# Patient Record
Sex: Female | Born: 1975 | Race: White | Hispanic: No | Marital: Married | Smoking: Never smoker
Health system: Southern US, Community
[De-identification: ages and names within clinical notes are randomized; demographics above are authoritative.]

## PROBLEM LIST (undated history)

## (undated) DIAGNOSIS — Z789 Other specified health status: Secondary | ICD-10-CM

## (undated) HISTORY — PX: OTHER SURGICAL HISTORY: SHX169

## (undated) HISTORY — PX: BREAST SURGERY: SHX581

---

## 2018-08-10 ENCOUNTER — Other Ambulatory Visit: Payer: Self-pay | Admitting: Neurosurgery

## 2018-08-17 NOTE — Pre-Procedure Instructions (Signed)
Wilkie AyeKristy Breth  08/17/2018      [facility] - [place], [place] - [id number] [place] [id number] Phone: [phone] Fax: [phone]    Your procedure is scheduled on Friday December 6.  [facility] Admitting at 1:15 P.M.  Call this number if you have problems the morning of surgery:  [phone]   Remember:  Do not eat or drink after midnight.    Take these medicines the morning of surgery with A SIP OF WATER: Gabapentin (neurontin)  7 days prior to surgery STOP taking any Aspirin(unless otherwise instructed by your surgeon), Aleve, Naproxen, Ibuprofen, Motrin, Advil, Goody's, BC's, all herbal medications, fish oil, and all vitamins     Do not wear jewelry, make-up or nail polish.  Do not wear lotions, powders, or perfumes, or deodorant.  Do not shave 48 hours prior to surgery.  Men may shave face and neck.  Do not bring valuables to the hospital.  [facility] is not responsible for any belongings or valuables.  Contacts, dentures or bridgework may not be worn into surgery.  Leave your suitcase in the car.  After surgery it may be brought to your room.  For patients admitted to the hospital, discharge time will be determined by your treatment team.  Patients discharged the day of surgery will not be allowed to drive home.   Special instructions:    [place]- Preparing For Surgery  Before surgery, you can play an important role. Because skin is not sterile, your skin needs to be as free of germs as possible. You can reduce the number of germs on your skin by washing with CHG (chlorahexidine gluconate) Soap before surgery.  CHG is an antiseptic cleaner which kills germs and bonds with the skin to continue killing germs even after washing.    Oral Hygiene is also important to reduce your risk of infection.  Remember - BRUSH YOUR TEETH THE MORNING OF SURGERY WITH YOUR REGULAR TOOTHPASTE  Please do not use if you  have an allergy to CHG or antibacterial soaps. If your skin becomes reddened/irritated stop using the CHG.  Do not shave (including legs and underarms) for at least 48 hours prior to first CHG shower. It is OK to shave your face.  Please follow these instructions carefully.   1. Shower the NIGHT BEFORE SURGERY and the MORNING OF SURGERY with CHG.   2. If you chose to wash your hair, wash your hair first as usual with your normal shampoo.  3. After you shampoo, rinse your hair and body thoroughly to remove the shampoo.  4. Use CHG as you would any other liquid soap. You can apply CHG directly to the skin and wash gently with a scrungie or a clean washcloth.   5. Apply the CHG Soap to your body ONLY FROM THE NECK DOWN.  Do not use on open wounds or open sores. Avoid contact with your eyes, ears, mouth and genitals (private parts). Wash Face and genitals (private parts)  with your normal soap.  6. Wash thoroughly, paying special attention to the area where your surgery will be performed.  7. Thoroughly rinse your body with warm water from the neck down.  8. DO NOT shower/wash with your normal soap after using and rinsing off the CHG Soap.  9. Pat yourself dry with a CLEAN TOWEL.  10. Wear CLEAN PAJAMAS to bed the night before surgery, wear comfortable clothes the morning  of surgery  11. Place CLEAN SHEETS on your bed the night of your first shower and DO NOT SLEEP WITH PETS.    Day of Surgery:  Do not apply any deodorants/lotions.  Please wear clean clothes to the hospital/surgery center.   Remember to brush your teeth WITH YOUR REGULAR TOOTHPASTE.    Please read over the following fact sheets that you were given. Coughing and Deep Breathing, MRSA Information and Surgical Site Infection Prevention

## 2018-08-20 ENCOUNTER — Encounter (HOSPITAL_COMMUNITY): Payer: Self-pay | Admitting: *Deleted

## 2018-08-20 ENCOUNTER — Other Ambulatory Visit: Payer: Self-pay

## 2018-08-20 ENCOUNTER — Encounter (HOSPITAL_COMMUNITY)
Admission: RE | Admit: 2018-08-20 | Discharge: 2018-08-20 | Disposition: A | Discharge status: Home / Self Care | Payer: BLUE CROSS/BLUE SHIELD | Source: Ambulatory Visit | Attending: Neurosurgery | Admitting: Neurosurgery

## 2018-08-20 DIAGNOSIS — Z01812 Encounter for preprocedural laboratory examination: Secondary | ICD-10-CM | POA: Insufficient documentation

## 2018-08-20 HISTORY — DX: Other specified health status: Z78.9

## 2018-08-20 LAB — CBC
HCT: 44.7 % (ref 36.0–46.0)
Hemoglobin: 14.6 g/dL (ref 12.0–15.0)
MCH: 31.2 pg (ref 26.0–34.0)
MCHC: 32.7 g/dL (ref 30.0–36.0)
MCV: 95.5 fL (ref 80.0–100.0)
Platelets: 385 10*3/uL (ref 150–400)
RBC: 4.68 MIL/uL (ref 3.87–5.11)
RDW: 12.2 % (ref 11.5–15.5)
WBC: 10.1 10*3/uL (ref 4.0–10.5)
nRBC: 0 % (ref 0.0–0.2)

## 2018-08-20 LAB — SURGICAL PCR SCREEN
MRSA, PCR: NEGATIVE
Staphylococcus aureus: NEGATIVE

## 2018-08-20 NOTE — Progress Notes (Addendum)
IB message sent to Dr. Lenward ChancellorNudelman,oreders need 2nd sign,pt. Has pre-admission visit today.  [facility]

## 2018-08-22 ENCOUNTER — Other Ambulatory Visit: Payer: Self-pay | Admitting: Neurosurgery

## 2018-08-24 ENCOUNTER — Ambulatory Visit (HOSPITAL_COMMUNITY): Payer: BLUE CROSS/BLUE SHIELD

## 2018-08-24 ENCOUNTER — Ambulatory Visit (HOSPITAL_COMMUNITY): Payer: BLUE CROSS/BLUE SHIELD | Admitting: Certified Registered Nurse Anesthetist

## 2018-08-24 ENCOUNTER — Observation Stay (HOSPITAL_COMMUNITY)
Admission: RE | Admit: 2018-08-24 | Discharge: 2018-08-25 | Disposition: A | Discharge status: Home / Self Care | Payer: BLUE CROSS/BLUE SHIELD | Source: Ambulatory Visit | Attending: Neurosurgery | Admitting: Neurosurgery

## 2018-08-24 ENCOUNTER — Encounter (HOSPITAL_COMMUNITY): Payer: Self-pay | Admitting: *Deleted

## 2018-08-24 ENCOUNTER — Other Ambulatory Visit: Payer: Self-pay

## 2018-08-24 ENCOUNTER — Encounter (HOSPITAL_COMMUNITY)
Admission: RE | Disposition: A | Discharge status: Home / Self Care | Payer: Self-pay | Source: Ambulatory Visit | Attending: Neurosurgery

## 2018-08-24 DIAGNOSIS — Z79899 Other long term (current) drug therapy: Secondary | ICD-10-CM | POA: Insufficient documentation

## 2018-08-24 DIAGNOSIS — M5116 Intervertebral disc disorders with radiculopathy, lumbar region: Secondary | ICD-10-CM | POA: Diagnosis present

## 2018-08-24 DIAGNOSIS — Z6839 Body mass index (BMI) 39.0-39.9, adult: Secondary | ICD-10-CM | POA: Diagnosis not present

## 2018-08-24 DIAGNOSIS — Z880 Allergy status to penicillin: Secondary | ICD-10-CM | POA: Diagnosis not present

## 2018-08-24 DIAGNOSIS — M4728 Other spondylosis with radiculopathy, sacral and sacrococcygeal region: Secondary | ICD-10-CM | POA: Diagnosis not present

## 2018-08-24 DIAGNOSIS — Z419 Encounter for procedure for purposes other than remedying health state, unspecified: Secondary | ICD-10-CM

## 2018-08-24 DIAGNOSIS — Q7649 Other congenital malformations of spine, not associated with scoliosis: Secondary | ICD-10-CM | POA: Insufficient documentation

## 2018-08-24 DIAGNOSIS — M5126 Other intervertebral disc displacement, lumbar region: Secondary | ICD-10-CM | POA: Diagnosis present

## 2018-08-24 HISTORY — PX: LUMBAR LAMINECTOMY/DECOMPRESSION MICRODISCECTOMY: SHX5026

## 2018-08-24 LAB — POCT PREGNANCY, URINE: Preg Test, Ur: NEGATIVE

## 2018-08-24 SURGERY — LUMBAR LAMINECTOMY/DECOMPRESSION MICRODISCECTOMY 1 LEVEL
Anesthesia: General | Laterality: Right

## 2018-08-24 MED ORDER — VANCOMYCIN HCL 1000 MG IV SOLR
INTRAVENOUS | Status: DC | PRN
  Administered 2018-08-24: 1000 mg via INTRAVENOUS

## 2018-08-24 MED ORDER — ONDANSETRON HCL 4 MG/2ML IJ SOLN
INTRAMUSCULAR | Status: DC | PRN
  Administered 2018-08-24: 4 mg via INTRAVENOUS

## 2018-08-24 MED ORDER — LIDOCAINE 2% (20 MG/ML) 5 ML SYRINGE
INTRAMUSCULAR | Status: AC
  Filled 2018-08-24: qty 5

## 2018-08-24 MED ORDER — SUGAMMADEX SODIUM 200 MG/2ML IV SOLN
INTRAVENOUS | Status: DC | PRN
  Administered 2018-08-24: 250 mg via INTRAVENOUS

## 2018-08-24 MED ORDER — LIDOCAINE-EPINEPHRINE 1 %-1:100000 IJ SOLN
INTRAMUSCULAR | Status: DC | PRN
  Administered 2018-08-24: 10 mL

## 2018-08-24 MED ORDER — HYDROCODONE-ACETAMINOPHEN 5-325 MG PO TABS
1.0000 | ORAL_TABLET | ORAL | Status: DC | PRN
  Administered 2018-08-24 – 2018-08-25 (×2): 2 via ORAL
  Filled 2018-08-24 (×2): qty 2

## 2018-08-24 MED ORDER — MIDAZOLAM HCL 5 MG/5ML IJ SOLN
INTRAMUSCULAR | Status: DC | PRN
  Administered 2018-08-24: 2 mg via INTRAVENOUS

## 2018-08-24 MED ORDER — THROMBIN 5000 UNITS EX SOLR
CUTANEOUS | Status: DC | PRN
  Administered 2018-08-24: 10000 [IU] via TOPICAL

## 2018-08-24 MED ORDER — CHLORHEXIDINE GLUCONATE CLOTH 2 % EX PADS: 6.0000 | MEDICATED_PAD | Freq: Once | CUTANEOUS | Status: DC

## 2018-08-24 MED ORDER — FENTANYL CITRATE (PF) 100 MCG/2ML IJ SOLN
INTRAMUSCULAR | Status: DC | PRN
  Administered 2018-08-24: 100 ug via INTRAVENOUS

## 2018-08-24 MED ORDER — TIZANIDINE HCL 4 MG PO TABS
4.0000 mg | ORAL_TABLET | Freq: Four times a day (QID) | ORAL | Status: DC | PRN
  Administered 2018-08-24: 4 mg via ORAL
  Filled 2018-08-24: qty 1

## 2018-08-24 MED ORDER — GABAPENTIN 300 MG PO CAPS
300.0000 mg | ORAL_CAPSULE | Freq: Three times a day (TID) | ORAL | Status: DC
  Administered 2018-08-24: 300 mg via ORAL
  Filled 2018-08-24: qty 1

## 2018-08-24 MED ORDER — PROPOFOL 10 MG/ML IV BOLUS
INTRAVENOUS | Status: DC | PRN
  Administered 2018-08-24: 200 mg via INTRAVENOUS

## 2018-08-24 MED ORDER — SODIUM CHLORIDE 0.9% FLUSH: 3.0000 mL | INTRAVENOUS | Status: DC | PRN

## 2018-08-24 MED ORDER — ROCURONIUM BROMIDE 50 MG/5ML IV SOSY
PREFILLED_SYRINGE | INTRAVENOUS | Status: AC
  Filled 2018-08-24: qty 5

## 2018-08-24 MED ORDER — KETOROLAC TROMETHAMINE 30 MG/ML IJ SOLN
30.0000 mg | Freq: Four times a day (QID) | INTRAMUSCULAR | Status: DC
  Administered 2018-08-24 – 2018-08-25 (×2): 30 mg via INTRAVENOUS
  Filled 2018-08-24 (×2): qty 1

## 2018-08-24 MED ORDER — KETOROLAC TROMETHAMINE 30 MG/ML IJ SOLN
30.0000 mg | Freq: Once | INTRAMUSCULAR | Status: AC
  Administered 2018-08-24: 30 mg via INTRAVENOUS

## 2018-08-24 MED ORDER — ACETAMINOPHEN 10 MG/ML IV SOLN
INTRAVENOUS | Status: DC | PRN
  Administered 2018-08-24: 1000 mg via INTRAVENOUS

## 2018-08-24 MED ORDER — MAGNESIUM HYDROXIDE 400 MG/5ML PO SUSP: 30.0000 mL | Freq: Every day | ORAL | Status: DC | PRN

## 2018-08-24 MED ORDER — FENTANYL CITRATE (PF) 100 MCG/2ML IJ SOLN
INTRAMUSCULAR | Status: AC
  Filled 2018-08-24: qty 2

## 2018-08-24 MED ORDER — PROPOFOL 10 MG/ML IV BOLUS
INTRAVENOUS | Status: AC
  Filled 2018-08-24: qty 40

## 2018-08-24 MED ORDER — PHENOL 1.4 % MT LIQD: 1.0000 | OROMUCOSAL | Status: DC | PRN

## 2018-08-24 MED ORDER — DIPHENHYDRAMINE HCL 50 MG/ML IJ SOLN
INTRAMUSCULAR | Status: AC
  Filled 2018-08-24: qty 1

## 2018-08-24 MED ORDER — FENTANYL CITRATE (PF) 250 MCG/5ML IJ SOLN
INTRAMUSCULAR | Status: DC | PRN
  Administered 2018-08-24: 50 ug via INTRAVENOUS
  Administered 2018-08-24: 25 ug via INTRAVENOUS
  Administered 2018-08-24 (×5): 50 ug via INTRAVENOUS
  Administered 2018-08-24: 25 ug via INTRAVENOUS
  Administered 2018-08-24: 100 ug via INTRAVENOUS
  Administered 2018-08-24: 50 ug via INTRAVENOUS

## 2018-08-24 MED ORDER — KETOROLAC TROMETHAMINE 30 MG/ML IJ SOLN
INTRAMUSCULAR | Status: AC
  Filled 2018-08-24: qty 1

## 2018-08-24 MED ORDER — METHYLPREDNISOLONE ACETATE 80 MG/ML IJ SUSP
INTRAMUSCULAR | Status: DC | PRN
  Administered 2018-08-24: 80 mg

## 2018-08-24 MED ORDER — BUPIVACAINE HCL (PF) 0.5 % IJ SOLN
INTRAMUSCULAR | Status: DC | PRN
  Administered 2018-08-24: 10 mL

## 2018-08-24 MED ORDER — SODIUM CHLORIDE 0.9 % IV SOLN: 250.0000 mL | INTRAVENOUS | Status: DC

## 2018-08-24 MED ORDER — DEXAMETHASONE SODIUM PHOSPHATE 10 MG/ML IJ SOLN
INTRAMUSCULAR | Status: AC
  Filled 2018-08-24: qty 1

## 2018-08-24 MED ORDER — ACETAMINOPHEN 650 MG RE SUPP
650.0000 mg | RECTAL | Status: DC | PRN
  Filled 2018-08-24: qty 1

## 2018-08-24 MED ORDER — DIPHENHYDRAMINE HCL 50 MG/ML IJ SOLN
INTRAMUSCULAR | Status: DC | PRN
  Administered 2018-08-24: 12.5 mg via INTRAVENOUS

## 2018-08-24 MED ORDER — THROMBIN 5000 UNITS EX SOLR
OROMUCOSAL | Status: DC | PRN
  Administered 2018-08-24: 16:00:00 via TOPICAL

## 2018-08-24 MED ORDER — MENTHOL 3 MG MT LOZG: 1.0000 | LOZENGE | OROMUCOSAL | Status: DC | PRN

## 2018-08-24 MED ORDER — FENTANYL CITRATE (PF) 250 MCG/5ML IJ SOLN
INTRAMUSCULAR | Status: AC
  Filled 2018-08-24: qty 5

## 2018-08-24 MED ORDER — CEFAZOLIN SODIUM-DEXTROSE 2-4 GM/100ML-% IV SOLN: 2.0000 g | INTRAVENOUS | Status: DC

## 2018-08-24 MED ORDER — LIDOCAINE 2% (20 MG/ML) 5 ML SYRINGE
INTRAMUSCULAR | Status: DC | PRN
  Administered 2018-08-24: 80 mg via INTRAVENOUS

## 2018-08-24 MED ORDER — HEMOSTATIC AGENTS (NO CHARGE) OPTIME
TOPICAL | Status: DC | PRN
  Administered 2018-08-24: 1 via TOPICAL

## 2018-08-24 MED ORDER — MORPHINE SULFATE (PF) 4 MG/ML IV SOLN: 4.0000 mg | INTRAVENOUS | Status: DC | PRN

## 2018-08-24 MED ORDER — THROMBIN 5000 UNITS EX SOLR
CUTANEOUS | Status: AC
  Filled 2018-08-24: qty 10000

## 2018-08-24 MED ORDER — HYDROMORPHONE HCL 1 MG/ML IJ SOLN
0.2500 mg | INTRAMUSCULAR | Status: DC | PRN
  Administered 2018-08-24 (×2): 0.5 mg via INTRAVENOUS

## 2018-08-24 MED ORDER — SODIUM CHLORIDE 0.9% FLUSH
3.0000 mL | Freq: Two times a day (BID) | INTRAVENOUS | Status: DC
  Administered 2018-08-24: 3 mL via INTRAVENOUS

## 2018-08-24 MED ORDER — BUPIVACAINE HCL (PF) 0.5 % IJ SOLN
INTRAMUSCULAR | Status: AC
  Filled 2018-08-24: qty 30

## 2018-08-24 MED ORDER — HYDROXYZINE HCL 25 MG PO TABS: 50.0000 mg | ORAL_TABLET | ORAL | Status: DC | PRN

## 2018-08-24 MED ORDER — VANCOMYCIN HCL IN DEXTROSE 1-5 GM/200ML-% IV SOLN
INTRAVENOUS | Status: AC
  Filled 2018-08-24: qty 200

## 2018-08-24 MED ORDER — SODIUM CHLORIDE 0.9 % IV SOLN
INTRAVENOUS | Status: DC | PRN
  Administered 2018-08-24: 16:00:00

## 2018-08-24 MED ORDER — HYDROXYZINE HCL 50 MG/ML IM SOLN: 50.0000 mg | INTRAMUSCULAR | Status: DC | PRN

## 2018-08-24 MED ORDER — THROMBIN 5000 UNITS EX SOLR
CUTANEOUS | Status: AC
  Filled 2018-08-24: qty 5000

## 2018-08-24 MED ORDER — CYCLOBENZAPRINE HCL 5 MG PO TABS: 5.0000 mg | ORAL_TABLET | Freq: Three times a day (TID) | ORAL | Status: DC | PRN

## 2018-08-24 MED ORDER — SCOPOLAMINE 1 MG/3DAYS TD PT72
1.0000 | MEDICATED_PATCH | TRANSDERMAL | Status: DC
  Administered 2018-08-24: 1.5 mg via TRANSDERMAL
  Filled 2018-08-24: qty 1

## 2018-08-24 MED ORDER — METHYLPREDNISOLONE ACETATE 80 MG/ML IJ SUSP
INTRAMUSCULAR | Status: AC
  Filled 2018-08-24: qty 1

## 2018-08-24 MED ORDER — ROCURONIUM BROMIDE 10 MG/ML (PF) SYRINGE
PREFILLED_SYRINGE | INTRAVENOUS | Status: DC | PRN
  Administered 2018-08-24: 10 mg via INTRAVENOUS
  Administered 2018-08-24 (×2): 20 mg via INTRAVENOUS
  Administered 2018-08-24: 50 mg via INTRAVENOUS

## 2018-08-24 MED ORDER — 0.9 % SODIUM CHLORIDE (POUR BTL) OPTIME
TOPICAL | Status: DC | PRN
  Administered 2018-08-24: 1000 mL

## 2018-08-24 MED ORDER — LACTATED RINGERS IV SOLN
INTRAVENOUS | Status: DC | PRN
  Administered 2018-08-24 (×2): via INTRAVENOUS

## 2018-08-24 MED ORDER — GENTAMICIN IN SALINE 1.6-0.9 MG/ML-% IV SOLN
80.0000 mg | INTRAVENOUS | Status: AC
  Administered 2018-08-24: 80 mg via INTRAVENOUS
  Filled 2018-08-24: qty 50

## 2018-08-24 MED ORDER — DEXAMETHASONE SODIUM PHOSPHATE 10 MG/ML IJ SOLN
INTRAMUSCULAR | Status: DC | PRN
  Administered 2018-08-24: 10 mg via INTRAVENOUS

## 2018-08-24 MED ORDER — ACETAMINOPHEN 325 MG PO TABS: 650.0000 mg | ORAL_TABLET | ORAL | Status: DC | PRN

## 2018-08-24 MED ORDER — ALUM & MAG HYDROXIDE-SIMETH 200-200-20 MG/5ML PO SUSP: 30.0000 mL | Freq: Four times a day (QID) | ORAL | Status: DC | PRN

## 2018-08-24 MED ORDER — LIDOCAINE-EPINEPHRINE 1 %-1:100000 IJ SOLN
INTRAMUSCULAR | Status: AC
  Filled 2018-08-24: qty 1

## 2018-08-24 MED ORDER — BISACODYL 10 MG RE SUPP: 10.0000 mg | Freq: Every day | RECTAL | Status: DC | PRN

## 2018-08-24 MED ORDER — HYDROMORPHONE HCL 1 MG/ML IJ SOLN
INTRAMUSCULAR | Status: AC
  Administered 2018-08-24: 0.5 mg via INTRAVENOUS
  Filled 2018-08-24: qty 1

## 2018-08-24 MED ORDER — ONDANSETRON HCL 4 MG/2ML IJ SOLN
INTRAMUSCULAR | Status: AC
  Filled 2018-08-24: qty 2

## 2018-08-24 MED ORDER — ACETAMINOPHEN 10 MG/ML IV SOLN
INTRAVENOUS | Status: AC
  Filled 2018-08-24: qty 100

## 2018-08-24 MED ORDER — FLEET ENEMA 7-19 GM/118ML RE ENEM: 1.0000 | ENEMA | Freq: Once | RECTAL | Status: DC | PRN

## 2018-08-24 MED ORDER — MIDAZOLAM HCL 2 MG/2ML IJ SOLN
INTRAMUSCULAR | Status: AC
  Filled 2018-08-24: qty 2

## 2018-08-24 MED ORDER — PROMETHAZINE HCL 25 MG/ML IJ SOLN: 6.2500 mg | INTRAMUSCULAR | Status: DC | PRN

## 2018-08-24 MED ORDER — HYDROCODONE-ACETAMINOPHEN 5-325 MG PO TABS: 1.0000 | ORAL_TABLET | ORAL | 0 refills | Status: AC | PRN

## 2018-08-24 SURGICAL SUPPLY — 58 items
BAG DECANTER FOR FLEXI CONT (MISCELLANEOUS) ×2 IMPLANT
BENZOIN TINCTURE PRP APPL 2/3 (GAUZE/BANDAGES/DRESSINGS) IMPLANT
BLADE CLIPPER SURG (BLADE) IMPLANT
BUR ACRON 5.0MM COATED (BURR) ×2 IMPLANT
BUR MATCHSTICK NEURO 3.0 LAGG (BURR) ×2 IMPLANT
CANISTER SUCT 3000ML PPV (MISCELLANEOUS) ×2 IMPLANT
CARTRIDGE OIL MAESTRO DRILL (MISCELLANEOUS) ×1 IMPLANT
COVER WAND RF STERILE (DRAPES) ×2 IMPLANT
DECANTER SPIKE VIAL GLASS SM (MISCELLANEOUS) ×2 IMPLANT
DERMABOND ADVANCED (GAUZE/BANDAGES/DRESSINGS) ×1
DERMABOND ADVANCED .7 DNX12 (GAUZE/BANDAGES/DRESSINGS) ×1 IMPLANT
DIFFUSER DRILL AIR PNEUMATIC (MISCELLANEOUS) ×2 IMPLANT
DRAPE LAPAROTOMY 100X72X124 (DRAPES) ×2 IMPLANT
DRAPE MICROSCOPE LEICA (MISCELLANEOUS) ×2 IMPLANT
DRAPE POUCH INSTRU U-SHP 10X18 (DRAPES) ×2 IMPLANT
ELECT BLADE 4.0 EZ CLEAN MEGAD (MISCELLANEOUS) ×2
ELECT REM PT RETURN 9FT ADLT (ELECTROSURGICAL) ×2
ELECTRODE BLDE 4.0 EZ CLN MEGD (MISCELLANEOUS) ×1 IMPLANT
ELECTRODE REM PT RTRN 9FT ADLT (ELECTROSURGICAL) ×1 IMPLANT
GAUZE 4X4 16PLY RFD (DISPOSABLE) IMPLANT
GAUZE SPONGE 4X4 12PLY STRL (GAUZE/BANDAGES/DRESSINGS) ×2 IMPLANT
GLOVE BIO SURGEON STRL SZ8 (GLOVE) ×2 IMPLANT
GLOVE BIOGEL PI IND STRL 8 (GLOVE) ×1 IMPLANT
GLOVE BIOGEL PI INDICATOR 8 (GLOVE) ×1
GLOVE ECLIPSE 7.5 STRL STRAW (GLOVE) ×2 IMPLANT
GLOVE EXAM NITRILE XL STR (GLOVE) IMPLANT
GLOVE INDICATOR 8.5 STRL (GLOVE) ×2 IMPLANT
GOWN STRL REUS W/ TWL LRG LVL3 (GOWN DISPOSABLE) IMPLANT
GOWN STRL REUS W/ TWL XL LVL3 (GOWN DISPOSABLE) ×1 IMPLANT
GOWN STRL REUS W/TWL 2XL LVL3 (GOWN DISPOSABLE) IMPLANT
GOWN STRL REUS W/TWL LRG LVL3 (GOWN DISPOSABLE)
GOWN STRL REUS W/TWL XL LVL3 (GOWN DISPOSABLE) ×1
HEMOSTAT POWDER KIT SURGIFOAM (HEMOSTASIS) ×2 IMPLANT
KIT BASIN OR (CUSTOM PROCEDURE TRAY) ×2 IMPLANT
KIT TURNOVER KIT B (KITS) ×2 IMPLANT
NEEDLE HYPO 18GX1.5 BLUNT FILL (NEEDLE) ×2 IMPLANT
NEEDLE SPNL 18GX3.5 QUINCKE PK (NEEDLE) ×2 IMPLANT
NEEDLE SPNL 22GX3.5 QUINCKE BK (NEEDLE) ×2 IMPLANT
NS IRRIG 1000ML POUR BTL (IV SOLUTION) ×2 IMPLANT
OIL CARTRIDGE MAESTRO DRILL (MISCELLANEOUS) ×2
PACK LAMINECTOMY NEURO (CUSTOM PROCEDURE TRAY) ×2 IMPLANT
PAD ARMBOARD 7.5X6 YLW CONV (MISCELLANEOUS) ×6 IMPLANT
PATTIES SURGICAL .5 X1 (DISPOSABLE) IMPLANT
RUBBERBAND STERILE (MISCELLANEOUS) ×4 IMPLANT
SPONGE LAP 4X18 RFD (DISPOSABLE) IMPLANT
SPONGE SURGIFOAM ABS GEL SZ50 (HEMOSTASIS) ×2 IMPLANT
STRIP CLOSURE SKIN 1/2X4 (GAUZE/BANDAGES/DRESSINGS) IMPLANT
SUT PROLENE 6 0 BV (SUTURE) IMPLANT
SUT VIC AB 1 CT1 18XBRD ANBCTR (SUTURE) ×1 IMPLANT
SUT VIC AB 1 CT1 8-18 (SUTURE) ×1
SUT VIC AB 2-0 CP2 18 (SUTURE) ×4 IMPLANT
SUT VIC AB 3-0 SH 8-18 (SUTURE) ×2 IMPLANT
SYR 3ML LL SCALE MARK (SYRINGE) ×2 IMPLANT
SYR 5ML LL (SYRINGE) IMPLANT
TAPE CLOTH SURG 6X10 WHT LF (GAUZE/BANDAGES/DRESSINGS) ×2 IMPLANT
TOWEL GREEN STERILE (TOWEL DISPOSABLE) ×2 IMPLANT
TOWEL GREEN STERILE FF (TOWEL DISPOSABLE) ×2 IMPLANT
WATER STERILE IRR 1000ML POUR (IV SOLUTION) ×2 IMPLANT

## 2018-08-24 NOTE — Transfer of Care (Addendum)
Immediate Anesthesia Transfer of Care Note  Patient: Kelly Cardenas  Procedure(s) Performed: RIGHT SACRAL ONE- SACRAL TWO LAMINECTOMY AND MICRODISCECTOMY (Right )  Patient Location: PACU  Anesthesia Type:General  Level of Consciousness: awake, alert  and oriented  Airway & Oxygen Therapy: Patient Spontanous Breathing and Patient connected to nasal cannula oxygen  Post-op Assessment: Report given to RN and Post -op Vital signs reviewed and stable  Post vital signs: Reviewed and stable  Last Vitals:  Vitals Value Taken Time  BP 138/71 08/24/2018  5:25 PM  Temp 36.6 C 08/24/2018  5:25 PM  Pulse 77 08/24/2018  5:27 PM  Resp 12 08/24/2018  5:27 PM  SpO2 98 % 08/24/2018  5:27 PM  Vitals shown include unvalidated device data.  Last Pain:  Vitals:   08/24/18 1352  TempSrc:   PainSc: 0-No pain      Patients Stated Pain Goal: 3 (08/24/18 1352)   Informed patient and Mel AlmondJada RN of the use of Sugammadex and need for additional contraception for 7 days.  Complications: No apparent anesthesia complications

## 2018-08-24 NOTE — H&P (Signed)
Subjective: Patient is a 42 y.o. right-handed white female who is admitted for treatment of large right S1-S2 lumbar disc herniation.  Patient's symptoms began this summer, with pain, numbness and tingling in the right posterior thigh and numbness and tingling in the right heel.  She was treated with gabapentin and prednisone without improvement.  She underwent chiropractic treatment without improvement.  MRI scan was done and revealed a large disc herniation.  However symptoms cleared up for about 6 to 8 weeks, but about 3 weeks ago the pain recurred and she is been having severe pain in the right posterior thigh with burning, numbness and tingling in the right posterior thigh and a bit of discomfort near the junction of the right buttock and posterior thigh, but no low back pain.  Continues to have numbness and tingling in the right heel as well as in the lateral aspect of the right foot.  She denies any weakness.  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  Patient admitted now for a right S1-S2 lumbar laminotomy and microdiscectomy.   Past Medical History:  Diagnosis Date  . Medical history non-contributory     Past Surgical History:  Procedure Laterality Date  . BREAST SURGERY Bilateral    breast implants  . removal wisdom teeth      Medications Prior to Admission  Medication Sig Dispense Refill Last Dose  . AVIANE 0.1-20 MG-MCG tablet Take 1 tablet by mouth daily.  1 08/23/2018 at Unknown time  . gabapentin (NEURONTIN) 300 MG capsule Take 300-600 mg by mouth 3 (three) times daily.   08/23/2018 at Unknown time  . tiZANidine (ZANAFLEX) 4 MG tablet Take 4 mg by mouth every 6 (six) hours as needed for muscle spasms.      Allergies  Allergen Reactions  . Penicillins Hives and Rash    Has patient had a PCN reaction causing immediate rash, facial/tongue/throat swelling, SOB  or lightheadedness with hypotension: No Has patient had a PCN reaction causing severe rash involving mucus membranes or skin necrosis: No Has patient had a PCN reaction that required hospitalization: No Has patient had a PCN reaction occurring within the last 10 years: No If all of the above answers are "NO", then may proceed with Cephalosporin use.'    Social History   Tobacco Use  . Smoking status: Never Smoker  . Smokeless tobacco: Never Used  Substance Use Topics  . Alcohol use: Never    Frequency: Never    History reviewed. No pertinent family history.   Review of Systems Pertinent items noted in HPI and remainder of comprehensive ROS otherwise negative.  Objective: Vital signs in last 24 hours: Temp:  [97.9 F (36.6 C)] 97.9 F (36.6 C) (12/06 1322) Pulse Rate:  [89] 89 (12/06 1322) Resp:  [20] 20 (12/06 1322) BP: (165)/(77) 165/77 (12/06 1322) SpO2:  [99 %] 99 % (12/06 1322) Weight:  [161 kg] 112 kg (12/06 1352)  EXAM: Patient well-developed well-nourished white female in no acute distress.   Lungs are clear to auscultation , the patient has symmetrical respiratory excursion. Heart has a regular rate and rhythm normal S1 and S2 no murmur.   Abdomen is soft nontender nondistended bowel sounds are present. Extremity examination shows no clubbing cyanosis or edema.  Straight leg raising is negative on the left, but positive on the right at 70 degrees, with pain running into the right  posterior thigh. Motor examination shows 5 over 5 strength in the lower extremities including the iliopsoas quadriceps dorsiflexor extensor hallicus  longus and plantar flexor bilaterally. Sensation is decreased to pinprick in the lateral aspect of the right foot. Reflexes are 1-2 in the quadriceps bilaterally, as well as the left gastrocnemius.  However the right gastrocnemius is minimal.. No pathologic reflexes are present.  Gait and stance both favor the right lower extremity.   Data  Review:CBC    Component Value Date/Time   WBC 10.1 08/20/2018 1441   RBC 4.68 08/20/2018 1441   HGB 14.6 08/20/2018 1441   HCT 44.7 08/20/2018 1441   PLT 385 08/20/2018 1441   MCV 95.5 08/20/2018 1441   MCH 31.2 08/20/2018 1441   MCHC 32.7 08/20/2018 1441   RDW 12.2 08/20/2018 1441    Assessment/Plan: Patient with right lumbar radicular pain with difficulty is been going on since this past summer, who is been found to have a large right S1-S2 lumbar disc herniation and is admitted now for a right S1-S2 lumbar laminotomy and microdiscectomy.  I've discussed with the patient the nature of his condition, the nature the surgical procedure, the typical length of surgery, hospital stay, and overall recuperation. We discussed limitations postoperatively. I discussed risks of surgery including risks of infection, bleeding, possibly need for transfusion, the risk of nerve root dysfunction with pain, weakness, numbness, or paresthesias, or risk of dural tear and CSF leakage and possible need for further surgery, the risk of recurrent disc herniation and the possible need for further surgery, and the risk of anesthetic complications including myocardial infarction, stroke, pneumonia, and death. Understanding all this the patient does wish to proceed with surgery and is admitted for such.   Hewitt ShortsNUDELMAN,ROBERT W, MD 08/24/2018 2:59 PM

## 2018-08-24 NOTE — Op Note (Signed)
08/24/2018  5:11 PM  PATIENT:  Kelly Cardenas  42 y.o. female  PRE-OPERATIVE DIAGNOSIS: Right S1-S2 disc herniation, spondylosis, degenerative disc disease, radiculopathy  POST-OPERATIVE DIAGNOSIS:  Right S1-S2 disc herniation, spondylosis, degenerative disc disease, radiculopathy  PROCEDURE:  Procedure(s):  RIGHT SACRAL ONE- SACRAL TWO LAMINOTOMY AND MICRODISCECTOMY  SURGEON: Shirlean Kellyobert Nudelman, MD  ASSISTANTS: Donalee CitrinGary Cram, MD  ANESTHESIA:   general  EBL:  Total I/O In: -  Out: 25 [Blood:25]  BLOOD ADMINISTERED:none  COUNT:  Correct per nursing staff  DICTATION: Patient was brought to the operating room and placed under general endotracheal anesthesia. Patient was turned to prone position the lumbosacral region was prepped with Betadine soap and solution and draped in a sterile fashion. The midline was infiltrated with local anesthetic with epinephrine. A localizing x-ray was taken and the S1-S2 level was identified. Midline incision was made over the one S2 level and was carried down through the subcutaneous tissue to the lumbosacral fascia. The fascia was incised on the right side and the paraspinal muscles were dissected from the spinous processes and lamina in a subperiosteal fashion. Another x-ray was taken and the S1-S2 intralaminar space was identified. The operating microscope was draped and brought into the field provided additional magnification, illumination, and visualization. Laminotomy was performed using the high-speed drill and Kerrison punches. The ligamentum flavum was carefully resected. The underlying thecal sac and nerve root were identified. The disc herniation was identified and the thecal sac and nerve root gently retracted medially.  The annulus was incised, and we began to remove a large spondylitic disc herniation.  It was removed in a piecemeal fashion.  The disc space itself was entered, and additional loose degenerated disc material was removed.  We were able to  progressively decompress the thecal sac and exiting nerve roots.  Once the discectomy was completed and good decompression of the thecal sac and nerve had been achieved hemostasis was established with the use of bipolar cautery and hemostasis was confirmed. We then instilled 2 cc of fentanyl and 80 mg of Depo-Medrol into the epidural space. Deep fascia was closed with interrupted undyed 1 Vicryl sutures. Scarpa's fascia was closed with interrupted undyed 1 Vicryl sutures in the subcutaneous and subcuticular layer were closed with interrupted inverted 2-0 and 3-0 undyed Vicryl sutures. The skin edges were approximated with Dermabond.  A dressing of sterile gauze and Hypafix was applied.  Following surgery the patient was turned back to a supine position to be reversed from the anesthetic extubated and transferred to the recovery room for further care.  PLAN OF CARE: Admit for overnight observation  PATIENT DISPOSITION:  PACU - hemodynamically stable.   Delay start of Pharmacological VTE agent (>24hrs) due to surgical blood loss or risk of bleeding:  yes

## 2018-08-24 NOTE — Anesthesia Postprocedure Evaluation (Signed)
Anesthesia Post Note  Patient: Kelly Cardenas  Procedure(s) Performed: RIGHT SACRAL ONE- SACRAL TWO LAMINECTOMY AND MICRODISCECTOMY (Right )     Patient location during evaluation: PACU Anesthesia Type: General Level of consciousness: sedated Pain management: pain level controlled Vital Signs Assessment: post-procedure vital signs reviewed and stable Respiratory status: spontaneous breathing and respiratory function stable Cardiovascular status: stable Postop Assessment: no apparent nausea or vomiting Anesthetic complications: no    Last Vitals:  Vitals:   08/24/18 1805 08/24/18 1830  BP: (!) 141/66 134/64  Pulse: 76 73  Resp: 18 18  Temp: 36.6 C 36.7 C  SpO2: 94% 98%    Last Pain:  Vitals:   08/24/18 1830  TempSrc: Oral  PainSc:                  Velora Horstman DANIEL

## 2018-08-24 NOTE — Anesthesia Preprocedure Evaluation (Addendum)
Anesthesia Evaluation  Patient identified by MRN, date of birth, ID band Patient awake    Reviewed: Allergy & Precautions, NPO status , Patient's Chart, lab work & pertinent test results  Airway Mallampati: II  TM Distance: >3 FB Neck ROM: Full    Dental no notable dental hx. (+) Dental Advisory Given   Pulmonary neg pulmonary ROS,    Pulmonary exam normal        Cardiovascular negative cardio ROS Normal cardiovascular exam     Neuro/Psych negative psych ROS   GI/Hepatic negative GI ROS, Neg liver ROS,   Endo/Other  Morbid obesity  Renal/GU negative Renal ROS  negative genitourinary   Musculoskeletal negative musculoskeletal ROS (+)   Abdominal   Peds negative pediatric ROS (+)  Hematology negative hematology ROS (+)   Anesthesia Other Findings   Reproductive/Obstetrics negative OB ROS                            Anesthesia Physical Anesthesia Plan  ASA: III  Anesthesia Plan: General   Post-op Pain Management:    Induction: Intravenous  PONV Risk Score and Plan: 4 or greater and Ondansetron, Dexamethasone, Scopolamine patch - Pre-op and Diphenhydramine  Airway Management Planned: Oral ETT  Additional Equipment:   Intra-op Plan:   Post-operative Plan: Extubation in OR  Informed Consent: I have reviewed the patients History and Physical, chart, labs and discussed the procedure including the risks, benefits and alternatives for the proposed anesthesia with the patient or authorized representative who has indicated his/her understanding and acceptance.   Dental advisory given  Plan Discussed with: CRNA and Anesthesiologist  Anesthesia Plan Comments:        Anesthesia Quick Evaluation

## 2018-08-24 NOTE — Anesthesia Procedure Notes (Signed)
Procedure Name: Intubation Date/Time: 08/24/2018 3:18 PM Performed by: Waynard EdwardsSmith, Tevis Dunavan A, CRNA Pre-anesthesia Checklist: Patient identified, Emergency Drugs available, Suction available and Patient being monitored Patient Re-evaluated:Patient Re-evaluated prior to induction Oxygen Delivery Method: Circle system utilized Preoxygenation: Pre-oxygenation with 100% oxygen Induction Type: IV induction Ventilation: Mask ventilation without difficulty Laryngoscope Size: Miller and 2 Grade View: Grade I Tube type: Oral Tube size: 7.0 mm Number of attempts: 1 Airway Equipment and Method: Stylet Placement Confirmation: ETT inserted through vocal cords under direct vision,  positive ETCO2 and breath sounds checked- equal and bilateral Secured at: 22 cm Tube secured with: Tape Dental Injury: Teeth and Oropharynx as per pre-operative assessment

## 2018-08-25 ENCOUNTER — Encounter (HOSPITAL_COMMUNITY): Payer: Self-pay | Admitting: Neurosurgery

## 2018-08-25 DIAGNOSIS — M4728 Other spondylosis with radiculopathy, sacral and sacrococcygeal region: Secondary | ICD-10-CM | POA: Diagnosis not present

## 2018-08-25 NOTE — Discharge Summary (Signed)
Physician Discharge Summary  Patient ID: Kelly Cardenas MRN: [id number] DOB/AGE: March 31, 1976 42 y.o. Estimated body mass index is 39.87 kg/m as calculated from the following:   Height as of this encounter: 5\' 6"  (1.676 m).   Weight as of this encounter: 112 kg.   Admit date: 08/24/2018 Discharge date: 08/25/2018  Admission Diagnoses:S1 radiculopathy  Discharge Diagnoses: same with herniated nucleus pulposis Active Problems:   Lumbar disc herniation   Discharged Condition: good  Hospital Course: patient is admitted hospital underwent S1-S2 laminectomy with a lumbarized sacral vertebra and disc space between S1 and S2. Postop patient did very well with complete resistor preoperative radicular symptoms. Angling and voiding spontaneous he tolerating regular diet stable for discharge home.  Consults: Significant Diagnostic Studies: Treatments:S1-S2 microscopic discectomy Discharge Exam: Blood pressure 101/66, pulse (!) 52, temperature 99.3 F (37.4 C), temperature source Oral, resp. rate 16, height 5\' 6"  (1.676 m), weight 112 kg, SpO2 97 %. Strength out of 5 wound clean dry and intact  Disposition: home  Discharge Instructions    Discharge wound care:   Complete by:  As directed    Leave the wound open to air. Shower daily with the wound uncovered. Water and soapy water should run over the incision area. Do not wash directly on the incision for 2 weeks. Remove the glue after 2 weeks.   Driving Restrictions   Complete by:  As directed    No driving for 2 weeks. May ride in the car locally now. May begin to drive locally in 2 weeks.   Other Restrictions   Complete by:  As directed    Walk gradually increasing distances out in the fresh air at least twice a day. Walking additional 6 times inside the house, gradually increasing distances, daily. No bending, lifting, or twisting. Perform activities between shoulder and waist height (that is at counter height when standing or table  height when sitting).     Allergies as of 08/25/2018      Reactions   Penicillins Hives, Rash   Has patient had a PCN reaction causing immediate rash, facial/tongue/throat swelling, SOB or lightheadedness with hypotension: No Has patient had a PCN reaction causing severe rash involving mucus membranes or skin necrosis: No Has patient had a PCN reaction that required hospitalization: No Has patient had a PCN reaction occurring within the last 10 years: No If all of the above answers are "NO", then may proceed with Cephalosporin use.'      Medication List    TAKE these medications   AVIANE 0.1-20 MG-MCG tablet Generic drug:  levonorgestrel-ethinyl estradiol Take 1 tablet by mouth daily.   gabapentin 300 MG capsule Commonly known as:  NEURONTIN Take 300-600 mg by mouth 3 (three) times daily.   HYDROcodone-acetaminophen 5-325 MG tablet Commonly known as:  NORCO/VICODIN Take 1-2 tablets by mouth every 4 (four) hours as needed (pain).   tiZANidine 4 MG tablet Commonly known as:  ZANAFLEX Take 4 mg by mouth every 6 (six) hours as needed for muscle spasms.            Discharge Care Instructions  (From admission, onward)         Start     Ordered   08/24/18 0000  Discharge wound care:    Comments:  Leave the wound open to air. Shower daily with the wound uncovered. Water and soapy water should run over the incision area. Do not wash directly on the incision for 2 weeks. Remove the glue after 2 weeks.  08/24/18 1738           Signed: Taelon Bendorf P 08/25/2018, 8:23 AM

## 2018-08-25 NOTE — Progress Notes (Signed)
Discharged instructions/education/AVS/Rx given to patient with husband at bedside and they both verbalized understanding. Patient ambulating well with minimal supervision and with steady gait. Pain is mild to moderate and controlled by PRN medications. Voiding and emptying bladder well. No drainage, no swelling, no redness noted on incision site. Discharged via  Wheelchair.

## 2019-05-15 IMAGING — CR DG LUMBAR SPINE 2-3V
1 series · 1 of 1 positions shown · non-contrast
Comparison: 08/10/2018

CLINICAL DATA: Laminectomy and micro discectomy for a herniated
nucleus pulposus.

EXAM:
LUMBAR SPINE - 2-3 VIEW

[lateral]
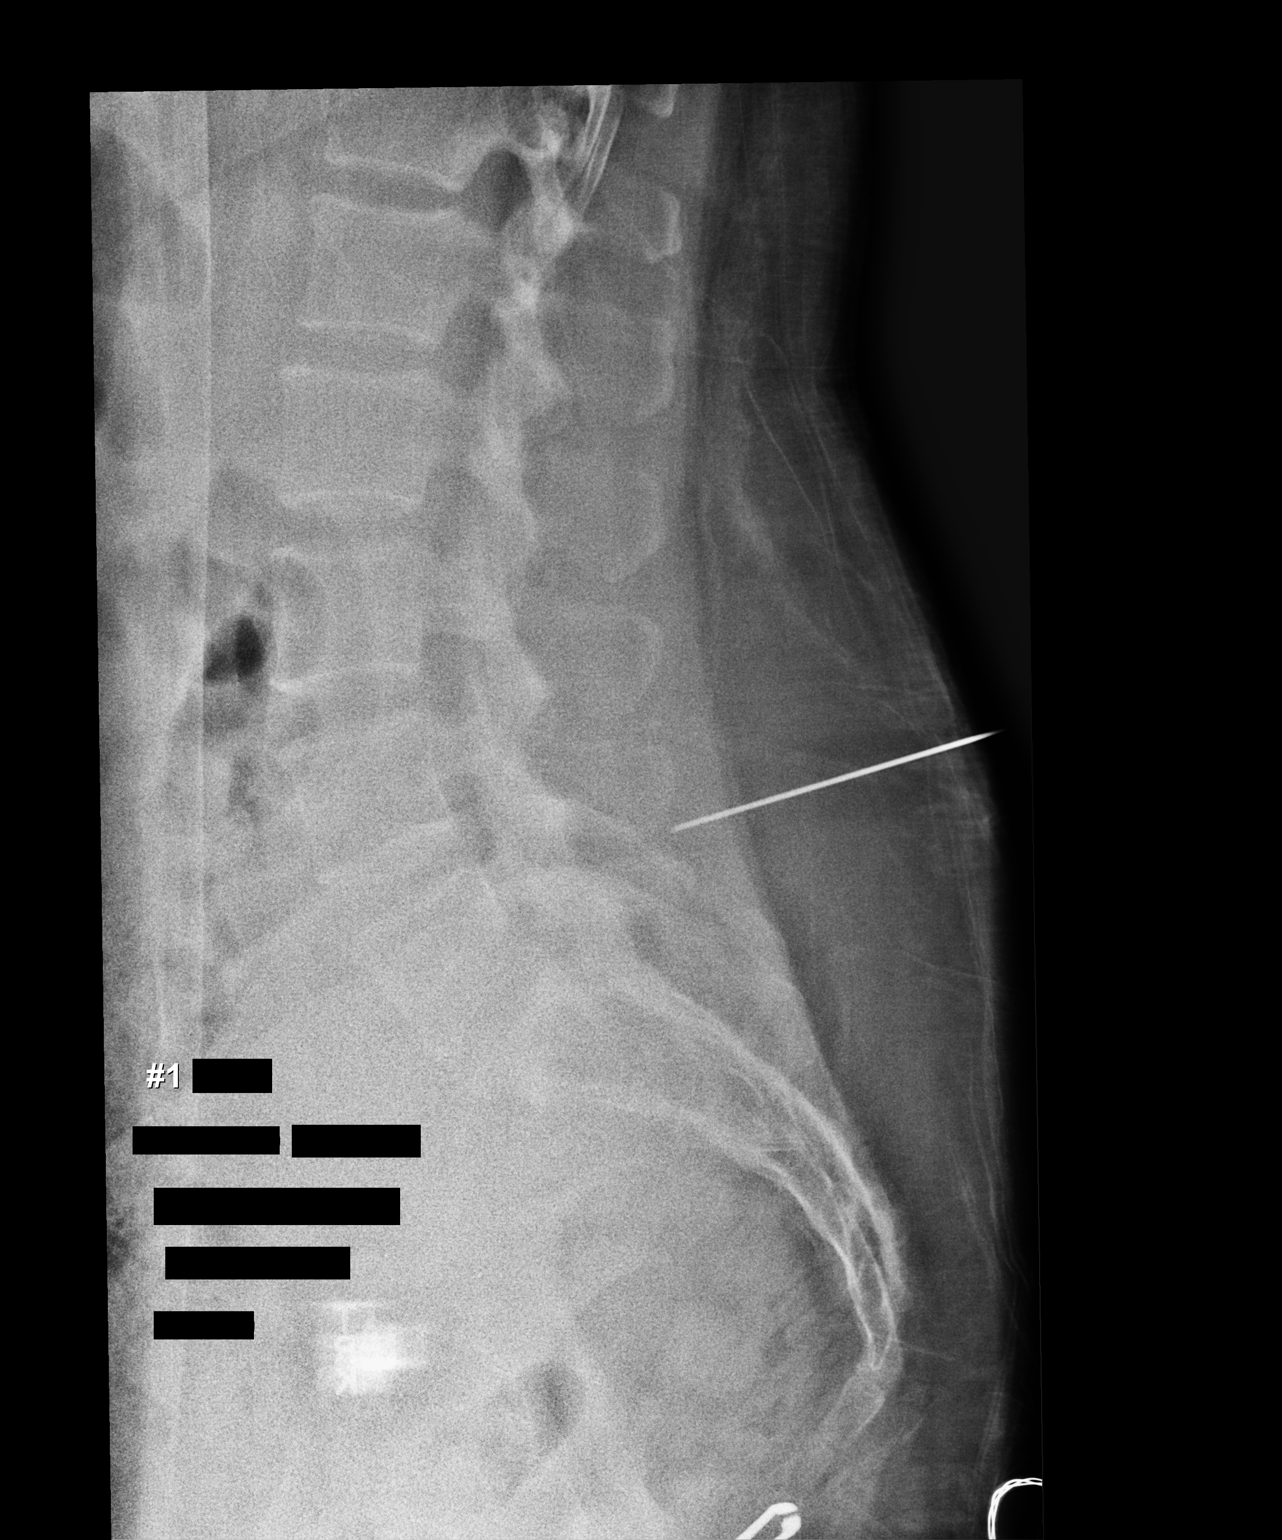

[1 of 1 positions shown; findings below may reference images not displayed]

FINDINGS: Based on the exam from 08/10/2018, there are 12 thoracic vertebral
bodies and 6 non rib-bearing vertebral bodies. S1 appears to
represent a transitional vertebral body. Image labeled as #1 film
has a surgical marker along the posterior aspect of S1. Image
labeled as #2 has a surgical marker posterior to S1-S2.
IMPRESSION: Surgical marking at S1-S2.
